# Patient Record
Sex: Female | Born: 1995 | Race: Black or African American | Hispanic: No | Marital: Single | Smoking: Never smoker
Health system: Southern US, Community
[De-identification: ages and names within clinical notes are randomized; demographics above are authoritative.]

## PROBLEM LIST (undated history)

## (undated) ENCOUNTER — Ambulatory Visit (HOSPITAL_COMMUNITY): Disposition: A | Discharge status: Other Acute Inpatient Hospital | Payer: PRIVATE HEALTH INSURANCE

## (undated) DIAGNOSIS — R011 Cardiac murmur, unspecified: Secondary | ICD-10-CM

## (undated) HISTORY — PX: TONSILLECTOMY AND ADENOIDECTOMY: SHX28

## (undated) HISTORY — PX: LARYNGETOMY: SHX5202

---
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
## 2016-07-07 ENCOUNTER — Encounter (HOSPITAL_COMMUNITY): Payer: Self-pay | Admitting: Family Medicine

## 2016-07-07 ENCOUNTER — Ambulatory Visit (HOSPITAL_COMMUNITY)
Admission: EM | Admit: 2016-07-07 | Discharge: 2016-07-07 | Disposition: A | Discharge status: Home / Self Care | Payer: PRIVATE HEALTH INSURANCE | Attending: Family Medicine | Admitting: Family Medicine

## 2016-07-07 DIAGNOSIS — J069 Acute upper respiratory infection, unspecified: Secondary | ICD-10-CM | POA: Diagnosis not present

## 2016-07-07 MED ORDER — IPRATROPIUM BROMIDE 0.06 % NA SOLN: 2.0000 | Freq: Four times a day (QID) | NASAL | 1 refills | Status: DC

## 2016-07-07 MED ORDER — AZITHROMYCIN 250 MG PO TABS: ORAL_TABLET | ORAL | 0 refills | Status: DC

## 2016-07-07 NOTE — Discharge Instructions (Signed)
Drink plenty of fluids as discussed, use medicine as prescribed, and mucinex or delsym for cough. Return or see your doctor if further problems °

## 2016-07-07 NOTE — ED Provider Notes (Signed)
[facility]    CSN: [id number] Arrival date & time: 07/07/16  1057     History   Chief Complaint Chief Complaint  Patient presents with  . Sore Throat  . Otalgia    HPI Mallory Flynn is a 21 y.o. female.   The history is provided by the patient.  Sore Throat  This is a new problem. The current episode started more than 2 days ago. The problem has been gradually worsening. Pertinent negatives include no chest pain, no abdominal pain and no shortness of breath.  Otalgia  Associated symptoms: congestion, cough, hearing loss, rhinorrhea and sore throat   Associated symptoms: no abdominal pain     Past Medical History:  Diagnosis Date  . Heart murmur     There are no active problems to display for this patient.   History reviewed. No pertinent surgical history.  OB History    No data available       Home Medications    Prior to Admission medications   Medication Sig Start Date End Date Taking? Authorizing Provider  naproxen (NAPROSYN) 500 MG tablet Take 1 tablet (500 mg total) by mouth 2 (two) times daily. 02/14/15   Riki SheerMichelle G Young, PA-C    Family History History reviewed. No pertinent family history.  Social History Social History  Substance Use Topics  . Smoking status: Never Smoker  . Smokeless tobacco: Never Used  . Alcohol use No     Allergies   Bactrim [sulfamethoxazole-trimethoprim]   Review of Systems Review of Systems  Constitutional: Negative.   HENT: Positive for congestion, ear pain, hearing loss, postnasal drip, rhinorrhea and sore throat.   Respiratory: Positive for cough. Negative for shortness of breath.   Cardiovascular: Negative for chest pain, palpitations and leg swelling.  Gastrointestinal: Negative for abdominal pain.     Physical Exam Triage Vital Signs ED Triage Vitals [07/07/16 1125]  Enc Vitals Group     BP 118/64     Pulse Rate 67     Resp 18     Temp 98.3 F (36.8 C)     Temp src      SpO2 100 %     Weight      Height      Head Circumference      Peak Flow      Pain Score 8     Pain Loc      Pain Edu?      Excl. in GC?    No data found.   Updated Vital Signs BP 118/64   Pulse 67   Temp 98.3 F (36.8 C)   Resp 18   LMP 07/05/2016   SpO2 100%   Visual Acuity Right Eye Distance:   Left Eye Distance:   Bilateral Distance:    Right Eye Near:   Left Eye Near:    Bilateral Near:     Physical Exam  Constitutional: She is oriented to person, place, and time. She appears well-developed and well-nourished. No distress.  HENT:  Right Ear: External ear and ear canal normal. Tympanic membrane is retracted. A middle ear effusion is present. Decreased hearing is noted.  Left Ear: Hearing, tympanic membrane, external ear and ear canal normal.  Mouth/Throat: Posterior oropharyngeal erythema present. No oropharyngeal exudate.  Cardiovascular: Normal rate, regular rhythm, normal heart sounds and intact distal pulses.   Pulmonary/Chest: Effort normal and breath sounds normal.  Lymphadenopathy:    She has no cervical adenopathy.  Neurological: She is  alert and oriented to person, place, and time.  Skin: Skin is warm and dry.  Nursing note and vitals reviewed.    UC Treatments / Results  Labs (all labs ordered are listed, but only abnormal results are displayed) Labs Reviewed - No data to display  EKG  EKG Interpretation None       Radiology No results found.  Procedures Procedures (including critical care time)  Medications Ordered in UC Medications - No data to display   Initial Impression / Assessment and Plan / UC Course  I have reviewed the triage vital signs and the nursing notes.  Pertinent labs & imaging results that were available during my care of the patient were reviewed by me and considered in my medical decision making (see chart for details).  Clinical Course       Final Clinical Impressions(s) / UC Diagnoses   Final diagnoses:  None     New Prescriptions New Prescriptions   No medications on file     Linna Hoff, MD 07/19/16 1327

## 2016-07-07 NOTE — ED Triage Notes (Signed)
Pt here for right ear pain and right throat pain. sts that she used to have tubes in right ear but fell out back in November.

## 2016-12-27 ENCOUNTER — Ambulatory Visit (HOSPITAL_COMMUNITY)
Admission: EM | Admit: 2016-12-27 | Discharge: 2016-12-27 | Disposition: A | Discharge status: Home / Self Care | Payer: PRIVATE HEALTH INSURANCE | Attending: Family Medicine | Admitting: Family Medicine

## 2016-12-27 ENCOUNTER — Encounter (HOSPITAL_COMMUNITY): Payer: Self-pay | Admitting: *Deleted

## 2016-12-27 DIAGNOSIS — H73011 Bullous myringitis, right ear: Secondary | ICD-10-CM

## 2016-12-27 LAB — POCT PREGNANCY, URINE: PREG TEST UR: NEGATIVE

## 2016-12-27 MED ORDER — FLUCONAZOLE 150 MG PO TABS: 150.0000 mg | ORAL_TABLET | Freq: Once | ORAL | 0 refills | Status: AC

## 2016-12-27 MED ORDER — AMOXICILLIN 500 MG PO CAPS: 500.0000 mg | ORAL_CAPSULE | Freq: Three times a day (TID) | ORAL | 0 refills | Status: DC

## 2016-12-27 NOTE — ED Provider Notes (Signed)
[facility]    CSN: [id number] Arrival date & time: 12/27/16  1726     History   Chief Complaint Chief Complaint  Patient presents with  . Otalgia    HPI Mallory Flynn is a 21 y.o. female.   HPI 21 year old female with history of bilateral myringotomy presents with complaint of right ear pain for 3 days. Pain associated with cough. Pain is worse at night. She denies fever, chills, hearing loss.  She denies left ear pain.    Past Medical History:  Diagnosis Date  . Heart murmur     There are no active problems to display for this patient.   History reviewed. No pertinent surgical history.  OB History    No data available       Home Medications    Prior to Admission medications   Medication Sig Start Date End Date Taking? Authorizing Provider  azithromycin (ZITHROMAX Z-PAK) 250 MG tablet Take as directed on pack 07/07/16   Linna Hoff, MD  ipratropium (ATROVENT) 0.06 % nasal spray Place 2 sprays into both nostrils 4 (four) times daily. 07/07/16   Linna Hoff, MD  naproxen (NAPROSYN) 500 MG tablet Take 1 tablet (500 mg total) by mouth 2 (two) times daily. 02/14/15   Riki Sheer, PA-C    Family History History reviewed. No pertinent family history.  Social History Social History  Substance Use Topics  . Smoking status: Never Smoker  . Smokeless tobacco: Never Used  . Alcohol use No     Allergies   Bactrim [sulfamethoxazole-trimethoprim]   Review of Systems Review of Systems  Constitutional: Negative for chills and fever.  HENT: Positive for ear pain. Negative for ear discharge and hearing loss.   Eyes: Negative for visual disturbance.  Respiratory: Positive for cough. Negative for shortness of breath.   Cardiovascular: Negative for chest pain.  Gastrointestinal: Negative for abdominal pain and blood in stool.  Musculoskeletal: Negative for arthralgias and back pain.  Skin: Negative for rash.  Allergic/Immunologic: Negative for  immunocompromised state.  Hematological: Negative for adenopathy. Does not bruise/bleed easily.  Psychiatric/Behavioral: Negative for dysphoric mood and suicidal ideas.     Physical Exam Triage Vital Signs ED Triage Vitals [12/27/16 1744]  Enc Vitals Group     BP 122/70     Pulse Rate 78     Resp 18     Temp 98.6 F (37 C)     Temp Source Oral     SpO2 100 %     Weight      Height      Head Circumference      Peak Flow      Pain Score      Pain Loc      Pain Edu?      Excl. in GC?    No data found.   Updated Vital Signs BP 122/70 (BP Location: Right Arm)   Pulse 78   Temp 98.6 F (37 C) (Oral)   Resp 18   LMP 11/04/2016 (Exact Date)   SpO2 100%   Visual Acuity Right Eye Distance:   Left Eye Distance:   Bilateral Distance:    Right Eye Near:   Left Eye Near:    Bilateral Near:     Physical Exam  Constitutional: She is oriented to person, place, and time. She appears well-developed and well-nourished. No distress.  HENT:  Head: Normocephalic and atraumatic.  Right Ear: External ear and ear canal normal. Tympanic membrane  is injected, erythematous and bulging.  Left Ear: Tympanic membrane, external ear and ear canal normal.  Ears:  Nose: Mucosal edema present.  Mouth/Throat: No tonsillar abscesses.    Cardiovascular: Normal rate, regular rhythm, normal heart sounds and intact distal pulses.   Pulmonary/Chest: Effort normal and breath sounds normal.  Musculoskeletal: She exhibits no edema.  Neurological: She is alert and oriented to person, place, and time.  Skin: Skin is warm and dry. No rash noted.  Psychiatric: She has a normal mood and affect.     UC Treatments / Results  Labs (all labs ordered are listed, but only abnormal results are displayed) Labs Reviewed - No data to display  EKG  EKG Interpretation None       Radiology No results found.  Procedures Procedures (including critical care time)  Medications Ordered in  UC Medications - No data to display   Initial Impression / Assessment and Plan / UC Course  I have reviewed the triage vital signs and the nursing notes.  Pertinent labs & imaging results that were available during my care of the patient were reviewed by me and considered in my medical decision making (see chart for details).      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 After amoxicillin course, Starting Wed 12/27/2016, Normal         Dessa PhiFunches, Merlyn Conley, MD 12/27/16 2110

## 2016-12-27 NOTE — ED Triage Notes (Signed)
Pt  Has  A  History  Of  Myringotomy     She  Has  A  r  Earache   As   Well  As  A  Cough     She  Repots   The   Pain is  Like   A    Throbbing   She  Is  Also  Late  On  Her  Period

## 2016-12-27 NOTE — Discharge Instructions (Signed)
°  Your urine pregnancy test was negative today.   Please complete course of amoxicillin for middle ear infection. Take the whole 10 days. Take Diflucan to prevent yeast after amoxicillin.  Please establish with a new primary care provider so you may obtain a referral to ENT.  Please return if you develop fever, worsening pain, hearing loss.

## 2017-01-12 ENCOUNTER — Encounter (HOSPITAL_COMMUNITY): Payer: Self-pay | Admitting: Emergency Medicine

## 2017-01-12 ENCOUNTER — Emergency Department (HOSPITAL_COMMUNITY)
Admission: EM | Admit: 2017-01-12 | Discharge: 2017-01-12 | Disposition: A | Discharge status: Home / Self Care | Payer: PRIVATE HEALTH INSURANCE | Attending: Emergency Medicine | Admitting: Emergency Medicine

## 2017-01-12 DIAGNOSIS — Z79899 Other long term (current) drug therapy: Secondary | ICD-10-CM | POA: Insufficient documentation

## 2017-01-12 DIAGNOSIS — N760 Acute vaginitis: Secondary | ICD-10-CM | POA: Insufficient documentation

## 2017-01-12 DIAGNOSIS — N946 Dysmenorrhea, unspecified: Secondary | ICD-10-CM

## 2017-01-12 DIAGNOSIS — R1030 Lower abdominal pain, unspecified: Secondary | ICD-10-CM | POA: Diagnosis present

## 2017-01-12 DIAGNOSIS — B9689 Other specified bacterial agents as the cause of diseases classified elsewhere: Secondary | ICD-10-CM | POA: Insufficient documentation

## 2017-01-12 DIAGNOSIS — R112 Nausea with vomiting, unspecified: Secondary | ICD-10-CM

## 2017-01-12 LAB — WET PREP, GENITAL
SPERM: NONE SEEN
TRICH WET PREP: NONE SEEN
YEAST WET PREP: NONE SEEN

## 2017-01-12 LAB — CBC
HEMATOCRIT: 40.2 % (ref 36.0–46.0)
Hemoglobin: 13.4 g/dL (ref 12.0–15.0)
MCH: 30 pg (ref 26.0–34.0)
MCHC: 33.3 g/dL (ref 30.0–36.0)
MCV: 89.9 fL (ref 78.0–100.0)
PLATELETS: 307 10*3/uL (ref 150–400)
RBC: 4.47 MIL/uL (ref 3.87–5.11)
RDW: 11.9 % (ref 11.5–15.5)
WBC: 7.1 10*3/uL (ref 4.0–10.5)

## 2017-01-12 LAB — COMPREHENSIVE METABOLIC PANEL
ALT: 15 U/L (ref 14–54)
AST: 21 U/L (ref 15–41)
Albumin: 3.9 g/dL (ref 3.5–5.0)
Alkaline Phosphatase: 76 U/L (ref 38–126)
Anion gap: 7 (ref 5–15)
BUN: 5 mg/dL — ABNORMAL LOW (ref 6–20)
CHLORIDE: 109 mmol/L (ref 101–111)
CO2: 22 mmol/L (ref 22–32)
Calcium: 9.1 mg/dL (ref 8.9–10.3)
Creatinine, Ser: 1.08 mg/dL — ABNORMAL HIGH (ref 0.44–1.00)
Glucose, Bld: 108 mg/dL — ABNORMAL HIGH (ref 65–99)
POTASSIUM: 3.6 mmol/L (ref 3.5–5.1)
Sodium: 138 mmol/L (ref 135–145)
Total Bilirubin: 0.3 mg/dL (ref 0.3–1.2)
Total Protein: 7 g/dL (ref 6.5–8.1)

## 2017-01-12 LAB — LIPASE, BLOOD: LIPASE: 26 U/L (ref 11–51)

## 2017-01-12 LAB — I-STAT BETA HCG BLOOD, ED (MC, WL, AP ONLY): I-stat hCG, quantitative: <5 m[IU]/mL (ref <5)

## 2017-01-12 MED ORDER — METRONIDAZOLE 500 MG PO TABS: 500.0000 mg | ORAL_TABLET | Freq: Two times a day (BID) | ORAL | 0 refills | Status: AC

## 2017-01-12 MED ORDER — ONDANSETRON HCL 4 MG/2ML IJ SOLN: 4.0000 mg | Freq: Once | INTRAMUSCULAR | Status: DC | PRN

## 2017-01-12 MED ORDER — SODIUM CHLORIDE 0.9 % IV BOLUS (SEPSIS)
1000.0000 mL | Freq: Once | INTRAVENOUS | Status: AC
  Administered 2017-01-12: 1000 mL via INTRAVENOUS

## 2017-01-12 MED ORDER — KETOROLAC TROMETHAMINE 30 MG/ML IJ SOLN
30.0000 mg | Freq: Once | INTRAMUSCULAR | Status: AC
  Administered 2017-01-12: 30 mg via INTRAVENOUS
  Filled 2017-01-12: qty 1

## 2017-01-12 NOTE — ED Notes (Signed)
Pt given water to drink per Dr. Haviland 

## 2017-01-12 NOTE — ED Provider Notes (Signed)
[facility]   CSN: [id number] Arrival date & time: 01/12/17  4098     History   Chief Complaint Chief Complaint  Patient presents with  . Abdominal Pain  . Vaginal Bleeding    HPI Mallory Flynn is a 21 y.o. female.  Pt presents with midline lower abdominal pain with nausea vomiting (brown, thick, nonbloody)/diarrhea(brown loose, non bloody)  beginning this morning.  She reports before this morning her last bowel movement was 2-3 weeks ago.  Additionally, she reports light vaginal bleeding yesterday and slightly heavier bleeding this morning.  Her LMP was June 16 and she reports it lasting 3 days which is 2 days less than normal.  She denies any dysuria or increased frequency of urination.  She is not on birth control at this time and uses condoms with her boyfriend of 2 years intermittently.  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  She is a never smoker, uses alcohol occasionally 1-2 drinks, and never uses recreational drugs.  She has had no prior abdominal surgeries and is on no medications at this time.  She has no family history of any bleeding disorders or gynecological disorder.        Past Medical History:  Diagnosis Date  . Heart murmur     There are no active problems to display for this patient.   History reviewed. No pertinent surgical history.  OB History    No data available       Home Medications    Prior to Admission medications   Medication Sig Start Date End Date Taking? Authorizing Provider  amoxicillin (AMOXIL) 500 MG capsule Take 1 capsule (500 mg total) by mouth 3 (three) times daily. 12/27/16   Funches, Gerilyn Nestle, MD  ipratropium (ATROVENT) 0.06 % nasal spray Place 2 sprays into both nostrils 4 (four) times daily. 07/07/16   Linna Hoff, MD  naproxen (NAPROSYN) 500 MG tablet Take 1 tablet (500 mg total) by mouth 2 (two) times daily. 02/14/15   Riki Sheer, PA-C    Family History No family  history on file.  Social History Social History  Substance Use Topics  . Smoking status: Never Smoker  . Smokeless tobacco: Never Used  . Alcohol use No     Allergies   Bactrim [sulfamethoxazole-trimethoprim]   Review of Systems Review of Systems  Constitutional: Positive for fatigue. Negative for chills and fever.  HENT: Negative for ear pain and sore throat.   Eyes: Negative for pain and visual disturbance.  Respiratory: Negative for cough and shortness of breath.   Cardiovascular: Negative for chest pain and palpitations.  Gastrointestinal: Positive for abdominal pain, diarrhea, nausea and vomiting.  Genitourinary: Positive for vaginal bleeding. Negative for decreased urine volume, difficulty urinating, dysuria, flank pain, hematuria and urgency.  Musculoskeletal: Negative for arthralgias and back pain.  Skin: Negative for color change and rash.  Neurological: Negative for seizures and syncope.  Hematological: Negative for adenopathy. Does not bruise/bleed easily.  Psychiatric/Behavioral: Negative for agitation, behavioral problems and confusion.  All other systems reviewed and are negative.    Physical Exam Updated Vital Signs BP 126/86 (BP Location: Left Arm)   Pulse 67   Temp 97.9 F (36.6 C) (Oral)   Resp 18   Ht 5\' 4"  (1.626 m)   Wt 74.8 kg (165 lb)   LMP 12/08/2016 (Approximate)   SpO2 100%   BMI 28.32 kg/m   Physical Exam  Constitutional: She  appears well-developed and well-nourished. No distress.  HENT:  Head: Normocephalic and atraumatic.  Eyes: Conjunctivae are normal.  Neck: Neck supple. No JVD present. No thyromegaly present.  Cardiovascular: Normal rate, regular rhythm, normal heart sounds and intact distal pulses.  Exam reveals no friction rub.   No murmur heard. Pulmonary/Chest: Effort normal and breath sounds normal. No stridor. No respiratory distress.  Abdominal: Soft. She exhibits no distension. Bowel sounds are increased. There is no  hepatosplenomegaly. There is no tenderness. There is no tenderness at McBurney's point.    Genitourinary: Vagina normal. Pelvic exam was performed with patient supine. There is no rash, tenderness or lesion on the right labia. There is no rash, tenderness or lesion on the left labia.  Musculoskeletal: She exhibits no edema.  Lymphadenopathy:    She has no cervical adenopathy.  Neurological: She is alert.  Skin: Skin is warm and dry.  Psychiatric: She has a normal mood and affect.  Nursing note and vitals reviewed.    ED Treatments / Results  Labs (all labs ordered are listed, but only abnormal results are displayed) Labs Reviewed  LIPASE, BLOOD  COMPREHENSIVE METABOLIC PANEL  CBC  URINALYSIS, ROUTINE W REFLEX MICROSCOPIC  I-STAT BETA HCG BLOOD, ED ([facility], [facility], [facility] ONLY)    EKG  EKG Interpretation None       Radiology No results found.  Procedures Procedures (including critical care time)  Medications Ordered in ED Medications  ondansetron (ZOFRAN) injection 4 mg (not administered)  sodium chloride 0.9 % bolus 1,000 mL (not administered)     Initial Impression / Assessment and Plan / ED Course  I have reviewed the triage vital signs and the nursing notes.  Pertinent labs & imaging results that were available during my care of the patient were reviewed by me and considered in my medical decision making (see chart for details).   Midline lower abdominal pain: began this morning associated with N/V/Diarrhea.   -Pregnancy Test negative -GC/chlamydia/wet prep ordered -IV fluids -Urinalysis -CBC, CMP normal -wet prep demonstrates clue cells suggestive of bacterial vaginosis discharge with flagyl    Final Clinical Impressions(s) / ED Diagnoses   Final diagnoses:  None    New Prescriptions New Prescriptions   No medications on file     Angelita InglesWinfrey, Daisa Stennis B, MD 01/12/17 1145    Jacalyn LefevreHaviland, Julie, MD 01/12/17 1154

## 2017-01-12 NOTE — Discharge Instructions (Addendum)
Please take antibiotics to completion and avoid alcohol use while on antibiotics

## 2017-01-12 NOTE — ED Triage Notes (Signed)
Pt reports vaginal bleeding since yesterday, denies clots, reports abd pain with n/v/d today.

## 2017-01-12 NOTE — ED Notes (Signed)
Pt reminded that a urine sample is needed; pt verbalized understanding 

## 2017-01-12 NOTE — ED Notes (Signed)
Pt aware that urine sample is needed, but is unable to provide one at this time 

## 2017-01-15 LAB — GC/CHLAMYDIA PROBE AMP (~~LOC~~) NOT AT ARMC
CHLAMYDIA, DNA PROBE: NEGATIVE
NEISSERIA GONORRHEA: NEGATIVE

## 2017-11-17 ENCOUNTER — Inpatient Hospital Stay (HOSPITAL_COMMUNITY)
Admission: AD | Admit: 2017-11-17 | Discharge: 2017-11-17 | Disposition: A | Discharge status: Home / Self Care | Payer: Managed Care, Other (non HMO) | Source: Ambulatory Visit | Attending: Obstetrics and Gynecology | Admitting: Obstetrics and Gynecology

## 2017-11-17 ENCOUNTER — Encounter (HOSPITAL_COMMUNITY): Payer: Self-pay | Admitting: *Deleted

## 2017-11-17 DIAGNOSIS — N926 Irregular menstruation, unspecified: Secondary | ICD-10-CM | POA: Insufficient documentation

## 2017-11-17 DIAGNOSIS — Z8249 Family history of ischemic heart disease and other diseases of the circulatory system: Secondary | ICD-10-CM | POA: Diagnosis not present

## 2017-11-17 DIAGNOSIS — Z3202 Encounter for pregnancy test, result negative: Secondary | ICD-10-CM | POA: Insufficient documentation

## 2017-11-17 DIAGNOSIS — Z882 Allergy status to sulfonamides status: Secondary | ICD-10-CM | POA: Insufficient documentation

## 2017-11-17 DIAGNOSIS — N939 Abnormal uterine and vaginal bleeding, unspecified: Secondary | ICD-10-CM | POA: Diagnosis present

## 2017-11-17 LAB — CBC
HCT: 38.5 % (ref 36.0–46.0)
HEMOGLOBIN: 12.8 g/dL (ref 12.0–15.0)
MCH: 30.7 pg (ref 26.0–34.0)
MCHC: 33.2 g/dL (ref 30.0–36.0)
MCV: 92.3 fL (ref 78.0–100.0)
Platelets: 294 10*3/uL (ref 150–400)
RBC: 4.17 MIL/uL (ref 3.87–5.11)
RDW: 12.2 % (ref 11.5–15.5)
WBC: 6.5 10*3/uL (ref 4.0–10.5)

## 2017-11-17 LAB — POCT PREGNANCY, URINE: Preg Test, Ur: NEGATIVE

## 2017-11-17 LAB — URINALYSIS, ROUTINE W REFLEX MICROSCOPIC
Bilirubin Urine: NEGATIVE
GLUCOSE, UA: NEGATIVE mg/dL
HGB URINE DIPSTICK: NEGATIVE
Ketones, ur: NEGATIVE mg/dL
LEUKOCYTES UA: NEGATIVE
Nitrite: NEGATIVE
PH: 7 (ref 5.0–8.0)
Protein, ur: NEGATIVE mg/dL
Specific Gravity, Urine: 1.015 (ref 1.005–1.030)

## 2017-11-17 LAB — HCG, QUANTITATIVE, PREGNANCY: hCG, Beta Chain, Quant, S: <1 m[IU]/mL (ref <5)

## 2017-11-17 NOTE — Discharge Instructions (Signed)
Abnormal Uterine Bleeding Abnormal uterine bleeding can affect women at various stages in life, including teenagers, women in their reproductive years, pregnant women, and women who have reached menopause. Several kinds of uterine bleeding are considered abnormal, including:  Bleeding or spotting between periods.  Bleeding after sexual intercourse.  Bleeding that is heavier or more than normal.  Periods that last longer than usual.  Bleeding after menopause. Many cases of abnormal uterine bleeding are minor and simple to treat, while others are more serious. Any type of abnormal bleeding should be evaluated by your health care provider. Treatment will depend on the cause of the bleeding. Follow these instructions at home: Monitor your condition for any changes. The following actions may help to alleviate any discomfort you are experiencing:  Avoid the use of tampons and douches as directed by your health care provider.  Change your pads frequently. You should get regular pelvic exams and Pap tests. Keep all follow-up appointments for diagnostic tests as directed by your health care provider. Contact a health care provider if:  Your bleeding lasts more than 1 week.  You feel dizzy at times. Get help right away if:  You pass out.  You are changing pads every 15 to 30 minutes.  You have abdominal pain.  You have a fever.  You become sweaty or weak.  You are passing large blood clots from the vagina.  You start to feel nauseous and vomit. This information is not intended to replace advice given to you by your health care provider. Make sure you discuss any questions you have with your health care provider. Document Released: 06/19/2005 Document Revised: 12/01/2015 Document Reviewed: 01/16/2013 Elsevier Interactive Patient Education  2017 Elsevier Inc.  

## 2017-11-17 NOTE — MAU Provider Note (Signed)
History     CSN: [id number]  Arrival date and time: 11/17/17 1239   First Provider Initiated Contact with Patient 11/17/17 1326     Chief Complaint  Patient presents with  . Vaginal Bleeding  . Possible Pregnancy   HPI Mallory Flynn is a 22 y.o. G0P0 at Unknown who presents requesting a D&C. She states she had a miscarriage last night and her husband suggested she come in for surgery. She states her LMP was 2/26 and she had a positive pregnancy test on 3/5. She states last night she had some bleeding and passed "a baby." She states it was the size of a quarter. She is having a small amount of dark red bleeding now. Denies any pain.  OB History   None     Past Medical History:  Diagnosis Date  . Heart murmur     Past Surgical History:  Procedure Laterality Date  . LARYNGETOMY    . TONSILLECTOMY AND ADENOIDECTOMY     2006    Family History  Problem Relation Age of Onset  . Hypertension Father     Social History   Tobacco Use  . Smoking status: Never Smoker  . Smokeless tobacco: Never Used  Substance Use Topics  . Alcohol use: No  . Drug use: No    Allergies:  Allergies  Allergen Reactions  . Bactrim [Sulfamethoxazole-Trimethoprim] Hives    Medications Prior to Admission  Medication Sig Dispense Refill Last Dose  . amoxicillin (AMOXIL) 500 MG capsule Take 1 capsule (500 mg total) by mouth 3 (three) times daily. (Patient not taking: Reported on 01/12/2017) 30 capsule 0 Not Taking at Unknown time  . ipratropium (ATROVENT) 0.06 % nasal spray Place 2 sprays into both nostrils 4 (four) times daily. (Patient not taking: Reported on 01/12/2017) 15 mL 1 Not Taking at Unknown time  . naproxen (NAPROSYN) 500 MG tablet Take 1 tablet (500 mg total) by mouth 2 (two) times daily. 30 tablet 0 Past Week at Unknown time    Review of Systems  Constitutional: Negative.  Negative for fatigue and fever.  HENT: Negative.   Respiratory: Negative.  Negative for shortness of breath.    Cardiovascular: Negative.  Negative for chest pain.  Gastrointestinal: Negative.  Negative for abdominal pain, constipation, diarrhea, nausea and vomiting.  Genitourinary: Positive for vaginal bleeding. Negative for dysuria and vaginal discharge.  Neurological: Negative.  Negative for dizziness and headaches.   Physical Exam   Blood pressure 123/74, pulse 72, temperature 98.1 F (36.7 C), temperature source Oral, resp. rate 16, weight 143 lb 0.6 oz (64.9 kg), last menstrual period 08/26/2017, SpO2 100 %.  Physical Exam  Nursing note and vitals reviewed. Constitutional: She is oriented to person, place, and time. She appears well-developed and well-nourished. No distress.  HENT:  Head: Normocephalic.  Eyes: Pupils are equal, round, and reactive to light.  Cardiovascular: Normal rate, regular rhythm and normal heart sounds.  Respiratory: Effort normal and breath sounds normal. No respiratory distress.  GI: Soft. Bowel sounds are normal. She exhibits no distension. There is no tenderness.  Neurological: She is alert and oriented to person, place, and time.  Skin: Skin is warm and dry.  Psychiatric: She has a normal mood and affect. Her behavior is normal. Judgment and thought content normal.    MAU Course  Procedures Results for orders placed or performed during the hospital encounter of 11/17/17 (from the past 24 hour(s))  Urinalysis, Routine w reflex microscopic     Status:  None   Collection Time: 11/17/17  1:00 PM  Result Value Ref Range   Color, Urine YELLOW YELLOW   APPearance CLEAR CLEAR   Specific Gravity, Urine 1.015 1.005 - 1.030   pH 7.0 5.0 - 8.0   Glucose, UA NEGATIVE NEGATIVE mg/dL   Hgb urine dipstick NEGATIVE NEGATIVE   Bilirubin Urine NEGATIVE NEGATIVE   Ketones, ur NEGATIVE NEGATIVE mg/dL   Protein, ur NEGATIVE NEGATIVE mg/dL   Nitrite NEGATIVE NEGATIVE   Leukocytes, UA NEGATIVE NEGATIVE  Pregnancy, urine POC     Status: None   Collection Time: 11/17/17   1:22 PM  Result Value Ref Range   Preg Test, Ur NEGATIVE NEGATIVE  CBC     Status: None   Collection Time: 11/17/17  1:33 PM  Result Value Ref Range   WBC 6.5 4.0 - 10.5 K/uL   RBC 4.17 3.87 - 5.11 MIL/uL   Hemoglobin 12.8 12.0 - 15.0 g/dL   HCT 16.1 09.6 - 04.5 %   MCV 92.3 78.0 - 100.0 fL   MCH 30.7 26.0 - 34.0 pg   MCHC 33.2 30.0 - 36.0 g/dL   RDW 40.9 81.1 - 91.4 %   Platelets 294 150 - 400 K/uL  hCG, quantitative, pregnancy     Status: None   Collection Time: 11/17/17  1:33 PM  Result Value Ref Range   hCG, Beta Chain, Quant, S <1 <5 mIU/mL   MDM UA, UPT CBC, HCG Patient declined pelvic exam. States she is no longer having bleeding or pain.  Reviewed negative HCG results with patient. Explained that the bleeding last night was not from a miscarriage and that she has not been pregnant recently. Patient verbalized understanding.   Assessment and Plan   1. Irregular menses   2. Pregnancy examination or test, negative result    -Discharge home in stable condition -Vaginal beeding precautions discussed -Patient advised to follow-up with gyn of choice for routine needs -Patient may return to MAU as needed or if her condition were to change or worsen  Rolm Bookbinder CNM 11/17/2017, 1:26 PM

## 2017-11-17 NOTE — MAU Note (Signed)
Patient reports that she had a miscarriage at home yesterday. Endorses having passed an "embryo".  +dark red vaginal bleeding LMP 08/26/17  Denies any pain at this time. States did have lower back pain that she rated a 6/10 yesterday.  Came in because she was told by her husband that she needs a DNC.

## 2018-04-27 ENCOUNTER — Encounter (HOSPITAL_COMMUNITY): Payer: Self-pay | Admitting: Emergency Medicine

## 2018-04-27 ENCOUNTER — Ambulatory Visit (HOSPITAL_COMMUNITY)
Admission: EM | Admit: 2018-04-27 | Discharge: 2018-04-27 | Disposition: A | Discharge status: Home / Self Care | Payer: Managed Care, Other (non HMO) | Attending: Family Medicine | Admitting: Family Medicine

## 2018-04-27 ENCOUNTER — Other Ambulatory Visit: Payer: Self-pay

## 2018-04-27 DIAGNOSIS — J069 Acute upper respiratory infection, unspecified: Secondary | ICD-10-CM

## 2018-04-27 NOTE — ED Provider Notes (Signed)
[facility]   [id number] 04/27/18 Arrival Time: 1548  ASSESSMENT & PLAN:  1. Viral upper respiratory tract infection    Discussed typical duration of symptoms. OTC symptom care as needed. Ensure adequate fluid intake and rest. May f/u with PCP or here as needed.  Reviewed expectations re: course of current medical issues. Questions answered. Outlined signs and symptoms indicating need for more acute intervention. Patient verbalized understanding. After Visit Summary given.   SUBJECTIVE: History from: patient.  Mallory Flynn is a 22 y.o. female who presents with complaint of nasal congestion, post-nasal drainage, and a mild dry cough. Onset abrupt, yesterday. Overall without fatigue and without body aches. SOB: none. Wheezing: none. Fever: no. Overall normal PO intake without n/v. Sick contacts: no. No specific or significant aggravating or alleviating factors reported. OTC treatment: none reported. Nasal congestion bothering her the most.  Received flu shot this year: no.  Social History   Tobacco Use  Smoking Status Never Smoker  Smokeless Tobacco Never Used    ROS: As per HPI.   OBJECTIVE:  Vitals:   04/27/18 1604  BP: 101/61  Pulse: 89  Resp: 16  Temp: 98.4 F (36.9 C)  TempSrc: Oral  SpO2: 100%     General appearance: alert; appears fatigued HEENT: nasal congestion; clear runny nose; throat irritation secondary to post-nasal drainage Neck: supple without LAD Lungs: unlabored respirations, symmetrical air entry without wheezing; cough: absent Psychological: alert and cooperative; normal mood and affect   Allergies  Allergen Reactions  . Bactrim [Sulfamethoxazole-Trimethoprim] Hives    Past Medical History:  Diagnosis Date  . Heart murmur    Family History  Problem Relation Age of Onset  . Hypertension Father    Social History   Socioeconomic History  . Marital status: Single    Spouse name: Not on file  . Number of children:  Not on file  . Years of education: Not on file  . Highest education level: Not on file  Occupational History  . Not on file  Social Needs  . Financial resource strain: Not on file  . Food insecurity:    Worry: Not on file    Inability: Not on file  . Transportation needs:    Medical: Not on file    Non-medical: Not on file  Tobacco Use  . Smoking status: Never Smoker  . Smokeless tobacco: Never Used  Substance and Sexual Activity  . Alcohol use: No  . Drug use: No  . Sexual activity: Yes    Birth control/protection: None  Lifestyle  . Physical activity:    Days per week: Not on file    Minutes per session: Not on file  . Stress: Not on file  Relationships  . Social connections:    Talks on phone: Not on file    Gets together: Not on file    Attends religious service: Not on file    Active member of club or organization: Not on file    Attends meetings of clubs or organizations: Not on file    Relationship status: Not on file  . Intimate partner violence:    Fear of current or ex partner: Not on file    Emotionally abused: Not on file    Physically abused: Not on file    Forced sexual activity: Not on file  Other Topics Concern  . Not on file  Social History Narrative  . Not on file           Brook,  Arlys John, MD 04/27/18 [phone]

## 2018-04-27 NOTE — ED Triage Notes (Signed)
The patient presented to the [facility] with a complaint of sinus pain and pressure x 2 days.  

## 2018-04-27 NOTE — Discharge Instructions (Signed)
You may try any over the counter cold medicines. Do your best to ensure adequate fluid intake and rest.

## 2019-12-08 ENCOUNTER — Encounter (HOSPITAL_COMMUNITY): Payer: Self-pay

## 2019-12-08 ENCOUNTER — Emergency Department (HOSPITAL_COMMUNITY)
Admission: EM | Admit: 2019-12-08 | Discharge: 2019-12-09 | Disposition: A | Discharge status: Home / Self Care | Payer: Self-pay | Attending: Emergency Medicine | Admitting: Emergency Medicine

## 2019-12-08 ENCOUNTER — Other Ambulatory Visit: Payer: Self-pay

## 2019-12-08 DIAGNOSIS — Z5321 Procedure and treatment not carried out due to patient leaving prior to being seen by health care provider: Secondary | ICD-10-CM | POA: Insufficient documentation

## 2019-12-08 DIAGNOSIS — N939 Abnormal uterine and vaginal bleeding, unspecified: Secondary | ICD-10-CM | POA: Insufficient documentation

## 2019-12-08 LAB — I-STAT BETA HCG BLOOD, ED (MC, WL, AP ONLY): I-stat hCG, quantitative: <5 m[IU]/mL (ref <5)

## 2019-12-08 NOTE — ED Triage Notes (Signed)
Pt arrives POV for eval of vag bleeding x 1.5 weeks. Pt reports unknown preg status. States she finished her period, stopped bleeding and then started shortly thereafter with this 1.5 weeks worth of bleeding. Pt reports LMP 5/15-5/19. Denies abd pain, back pain, cramping\

## 2019-12-09 NOTE — ED Notes (Signed)
Pt called for vital x3. No answer.

## 2020-01-13 ENCOUNTER — Emergency Department (HOSPITAL_COMMUNITY): Payer: Self-pay

## 2020-01-13 ENCOUNTER — Emergency Department (HOSPITAL_COMMUNITY)
Admission: EM | Admit: 2020-01-13 | Discharge: 2020-01-13 | Disposition: A | Discharge status: Home / Self Care | Payer: Self-pay | Attending: Emergency Medicine | Admitting: Emergency Medicine

## 2020-01-13 ENCOUNTER — Encounter (HOSPITAL_COMMUNITY): Payer: Self-pay | Admitting: Emergency Medicine

## 2020-01-13 DIAGNOSIS — Y939 Activity, unspecified: Secondary | ICD-10-CM | POA: Insufficient documentation

## 2020-01-13 DIAGNOSIS — Y999 Unspecified external cause status: Secondary | ICD-10-CM | POA: Insufficient documentation

## 2020-01-13 DIAGNOSIS — S92354A Nondisplaced fracture of fifth metatarsal bone, right foot, initial encounter for closed fracture: Secondary | ICD-10-CM | POA: Insufficient documentation

## 2020-01-13 DIAGNOSIS — Y9259 Other trade areas as the place of occurrence of the external cause: Secondary | ICD-10-CM | POA: Insufficient documentation

## 2020-01-13 DIAGNOSIS — W51XXXA Accidental striking against or bumped into by another person, initial encounter: Secondary | ICD-10-CM | POA: Insufficient documentation

## 2020-01-13 DIAGNOSIS — R52 Pain, unspecified: Secondary | ICD-10-CM

## 2020-01-13 MED ORDER — NAPROXEN 500 MG PO TABS: 500.0000 mg | ORAL_TABLET | Freq: Two times a day (BID) | ORAL | 0 refills | Status: AC

## 2020-01-13 MED ORDER — NAPROXEN 250 MG PO TABS
500.0000 mg | ORAL_TABLET | Freq: Once | ORAL | Status: AC
  Administered 2020-01-13: 500 mg via ORAL
  Filled 2020-01-13: qty 2
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
## 2020-01-13 NOTE — Discharge Instructions (Signed)
We have placed your foot on a postop shoe.  You will need to keep this in place for the next few weeks.  The number to the orthopedist is attached to your chart, please schedule an appointment for further management of your broken right pinky toe.  I have prescribed a short course of anti-inflammatories for you to take, take 1 tablet twice a day for the next 7 days.

## 2021-09-08 ENCOUNTER — Encounter (HOSPITAL_COMMUNITY): Payer: Self-pay | Admitting: Emergency Medicine

## 2021-09-08 ENCOUNTER — Emergency Department (HOSPITAL_COMMUNITY)
Admission: EM | Admit: 2021-09-08 | Discharge: 2021-09-08 | Disposition: A | Discharge status: Home / Self Care | Payer: PRIVATE HEALTH INSURANCE | Attending: Emergency Medicine | Admitting: Emergency Medicine

## 2021-09-08 DIAGNOSIS — U071 COVID-19: Secondary | ICD-10-CM | POA: Insufficient documentation

## 2021-09-08 MED ORDER — FLUTICASONE PROPIONATE 50 MCG/ACT NA SUSP: 2.0000 | Freq: Every day | NASAL | 0 refills | Status: AC

## 2021-09-08 MED ORDER — BENZONATATE 100 MG PO CAPS: 100.0000 mg | ORAL_CAPSULE | Freq: Three times a day (TID) | ORAL | 0 refills | Status: AC

## 2021-09-08 NOTE — ED Provider Notes (Signed)
?[facility]-EMERGENCY DEPT ?Provider Note ? ? ?CSN: [id number] ?Arrival date & time: 09/08/21  2102 ? ?  ? ?History ? ?Chief Complaint  ?Patient presents with  ? Covid Positive  ? ? ?LEONARD HENDLER is a 26 y.o. female here for positive COVID test at home.  She denies any recent exposures.  She denies any current symptoms.  No headache, congestion, rhinorrhea, fever, emesis, cough, abdominal pain, diarrhea or dysuria.  States "something inside me tell me to take a test."  And it was positive.  She has normal appetite, tolerating p.o. intake at home without difficulty ? ?HPI ? ?  ? ?Home Medications ?Prior to Admission medications   ?Medication Sig Start Date End Date Taking? Authorizing Provider  ?benzonatate (TESSALON) 100 MG capsule Take 1 capsule (100 mg total) by mouth every 8 (eight) hours. 09/08/21  Yes Jerolene Kupfer A, PA-C  ?fluticasone (FLONASE) 50 MCG/ACT nasal spray Place 2 sprays into both nostrils daily. 09/08/21  Yes Alinah Sheard A, PA-C  ?   ? ?Allergies    ?Sulfamethoxazole-trimethoprim and Bactrim [sulfamethoxazole-trimethoprim]   ? ?Review of Systems   ?Review of Systems  ?Constitutional: Negative.   ?HENT: Negative.    ?Respiratory: Negative.    ?Cardiovascular: Negative.   ?Gastrointestinal: Negative.   ?Genitourinary: Negative.   ?Musculoskeletal: Negative.   ?Skin: Negative.   ?Neurological: Negative.   ?All other systems reviewed and are negative. ? ?Physical Exam ?Updated Vital Signs ?BP 120/78   Pulse 91   Temp 98.7 ?F (37.1 ?C) (Oral)   Resp 16   SpO2 100%  ?Physical Exam ?Vitals and nursing note reviewed.  ?Constitutional:   ?   General: She is not in acute distress. ?   Appearance: She is well-developed. She is not ill-appearing.  ?HENT:  ?   Head: Normocephalic and atraumatic.  ?   Nose: Nose normal.  ?   Mouth/Throat:  ?   Mouth: Mucous membranes are moist.  ?Eyes:  ?   Pupils: Pupils are equal, round, and reactive to light.  ?Cardiovascular:  ?   Rate and Rhythm:  Normal rate.  ?   Pulses: Normal pulses.  ?   Heart sounds: Normal heart sounds.  ?Pulmonary:  ?   Effort: Pulmonary effort is normal. No respiratory distress.  ?   Breath sounds: Normal breath sounds.  ?Abdominal:  ?   General: Bowel sounds are normal. There is no distension.  ?   Palpations: Abdomen is soft.  ?   Tenderness: There is no abdominal tenderness. There is no guarding or rebound.  ?Musculoskeletal:     ?   General: No swelling or tenderness. Normal range of motion.  ?   Cervical back: Normal range of motion.  ?Skin: ?   General: Skin is warm and dry.  ?   Capillary Refill: Capillary refill takes less than 2 seconds.  ?Neurological:  ?   General: No focal deficit present.  ?   Mental Status: She is alert and oriented to person, place, and time.  ?Psychiatric:     ?   Mood and Affect: Mood normal.  ? ? ?ED Results / Procedures / Treatments   ?Labs ?(all labs ordered are listed, but only abnormal results are displayed) ?Labs Reviewed - No data to display ? ?EKG ?None ? ?Radiology ?No results found. ? ?Procedures ?Procedures  ? ? ?Medications Ordered in ED ?Medications - No data to display ? ?ED Course/ Medical Decision Making/ A&P ?  ? ?26 year old appears  otherwise well here for evaluation of positive COVID test.  She denies any current symptoms or exposures.  She appears otherwise well, heart and lungs clear.  Abdomen soft, nontender, tolerating p.o. intake.  She is ambulatory here.  Do not feel we need to repeat her COVID test.  We will write her for a work note as well as a few medications in the meantime in case she develops cough or runny nose.  We will have her follow-up outpatient. ? ?The patient has been appropriately medically screened and/or stabilized in the ED. I have low suspicion for any other emergent medical condition which would require further screening, evaluation or treatment in the ED or require inpatient management. ? ?Patient is hemodynamically stable and in no acute distress.   Patient able to ambulate in department prior to ED.  Evaluation does not show acute pathology that would require ongoing or additional emergent interventions while in the emergency department or further inpatient treatment.  I have discussed the diagnosis with the patient and answered all questions.  Pain is been managed while in the emergency department and patient has no further complaints prior to discharge.  Patient is comfortable with plan discussed in room and is stable for discharge at this time.  I have discussed strict return precautions for returning to the emergency department.  Patient was encouraged to follow-up with PCP/specialist refer to at discharge.  ? ?                        ?Medical Decision Making ?Risk ?OTC drugs. ?Prescription drug management. ? ? ? ? ? ? ? ? ? ?Final Clinical Impression(s) / ED Diagnoses ?Final diagnoses:  ?COVID  ? ? ?Rx / DC Orders ?ED Discharge Orders   ? ?      Ordered  ?  benzonatate (TESSALON) 100 MG capsule  Every 8 hours       ? 09/08/21 2145  ?  fluticasone (FLONASE) 50 MCG/ACT nasal spray  Daily       ? 09/08/21 2145  ? ?  ?  ? ?  ? ? ?  ?Indea Dearman A, PA-C ?09/08/21 2147 ? ?  ?Mancel Bale, MD ?09/08/21 2313 ? ?

## 2021-09-08 NOTE — ED Triage Notes (Signed)
Pt reports that she tested positive for COVID yesterday. Denies symptoms, just thought she should get "checked" ?

## 2021-09-08 NOTE — Discharge Instructions (Addendum)
I have written you for a few medications if you develop any symptoms, I have written you for a work note as well.  Return for new or worsening symptom ?

## 2021-12-28 IMAGING — CR DG FOOT COMPLETE 3+V*R*
4 series · 4 of 4 positions shown · non-contrast
Comparison: None.

CLINICAL DATA: Right foot pain at the fifth metatarsal and fifth
toe for 2 days. Bruising and swelling.

EXAM:
RIGHT FOOT COMPLETE - 3+ VIEW

[foot ap]
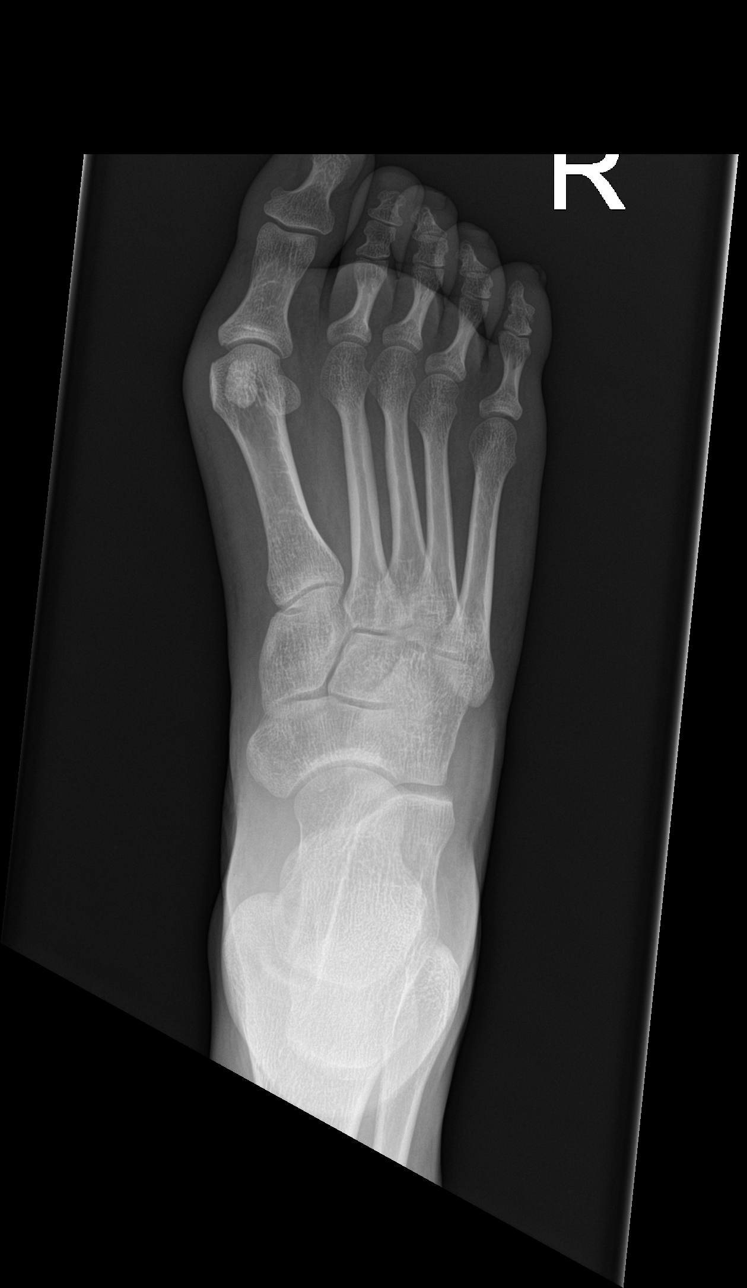

[foot obl]
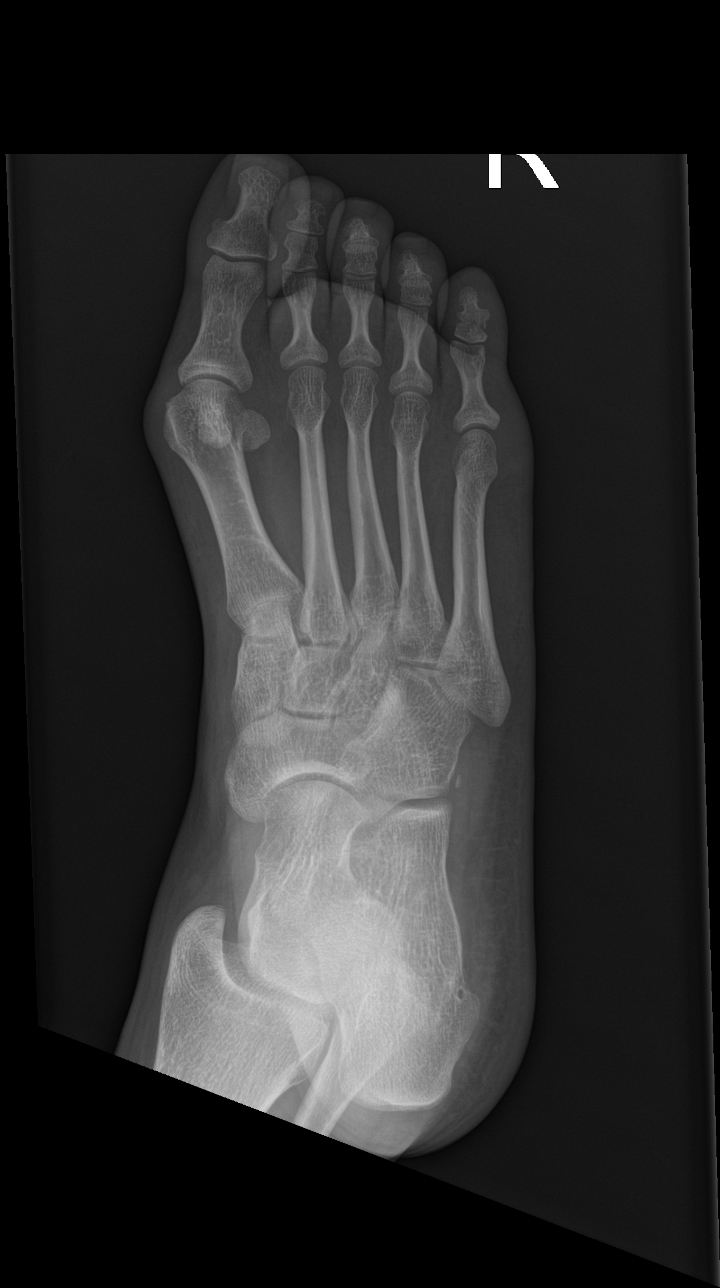

[foot lat (1 of 2)]
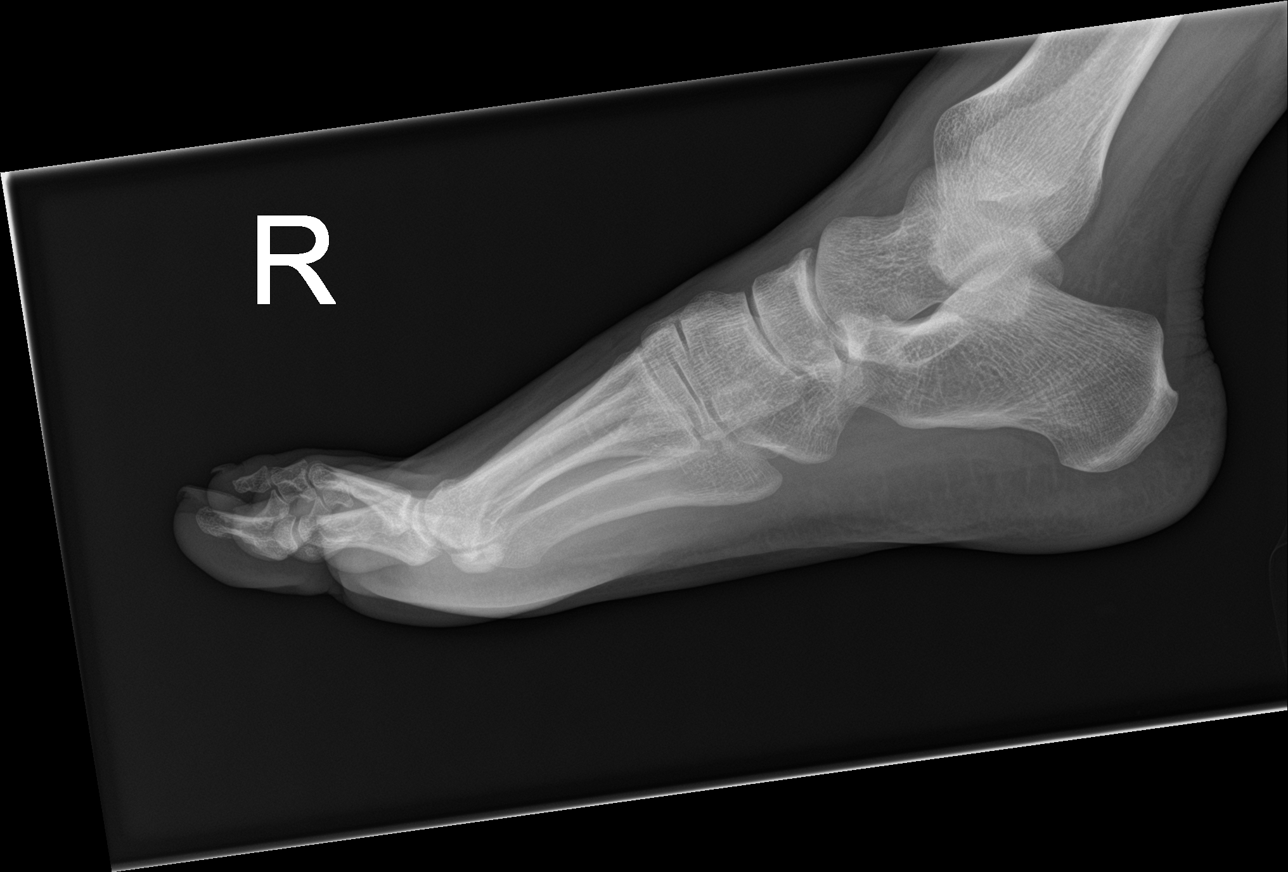

[foot lat (2 of 2)]
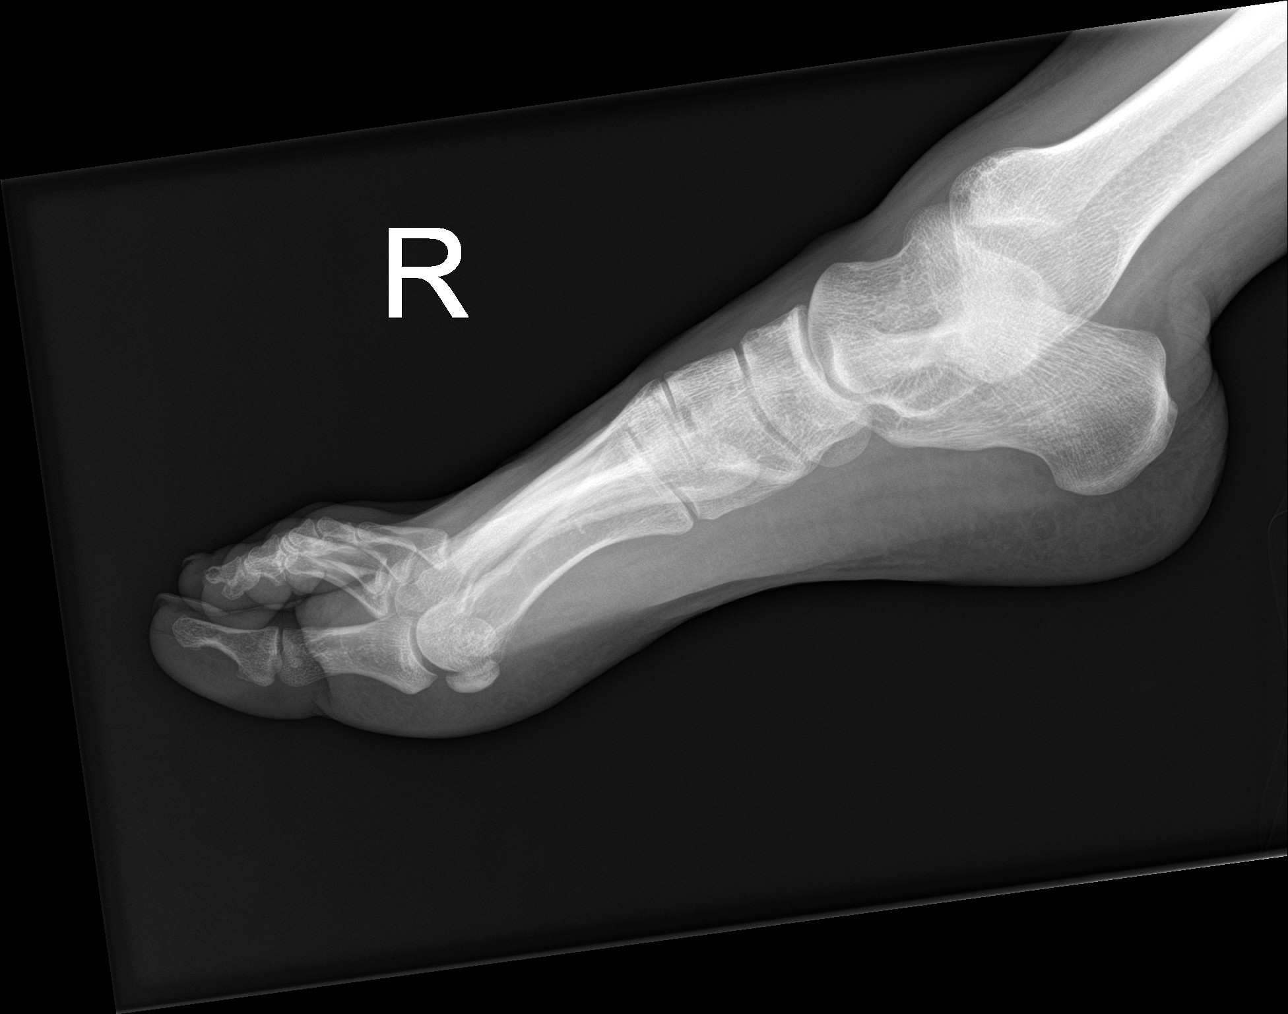

[4 of 4 positions shown; findings below may reference images not displayed]

FINDINGS: There is a nondisplaced fracture of the distal phalanx of the fifth
toe with mild regional soft tissue swelling. There is no
dislocation. No suspicious osseous lesion is identified.
IMPRESSION: Nondisplaced fracture of the fifth toe distal phalanx.

## 2022-08-14 ENCOUNTER — Emergency Department (HOSPITAL_COMMUNITY)
Admission: EM | Admit: 2022-08-14 | Discharge: 2022-08-14 | Disposition: A | Discharge status: Home / Self Care | Payer: PRIVATE HEALTH INSURANCE | Attending: Emergency Medicine | Admitting: Emergency Medicine

## 2022-08-14 ENCOUNTER — Other Ambulatory Visit: Payer: Self-pay

## 2022-08-14 DIAGNOSIS — R0981 Nasal congestion: Secondary | ICD-10-CM | POA: Diagnosis present

## 2022-08-14 DIAGNOSIS — J069 Acute upper respiratory infection, unspecified: Secondary | ICD-10-CM | POA: Diagnosis not present

## 2022-08-14 MED ORDER — BENZONATATE 100 MG PO CAPS: 100.0000 mg | ORAL_CAPSULE | Freq: Three times a day (TID) | ORAL | 0 refills | Status: AC

## 2022-08-14 NOTE — ED Triage Notes (Signed)
C/o fatigue, headache, runny nose, congestion, and cough since Wednesday  2 at home covid test positive on Friday.

## 2022-08-14 NOTE — Discharge Instructions (Signed)
It was a pleasure taking care of you today.  As discussed, I am sending you home with cough medication.  Take as needed for cough.  Your COVID and flu test are pending.  Results should be available within the next few hours.  Return to the ER for new or worsening symptoms.

## 2022-08-14 NOTE — ED Provider Notes (Signed)
[place] AT [facility] Provider Note   CSN: [id number] Arrival date & time: 08/14/22  1747     History  Chief Complaint  Patient presents with   Nasal Congestion    Mallory Flynn is a 27 y.o. female with a past medical history significant for heart murmur who presents to the ED due to fatigue, headache, rhinorrhea, nasal congestion, and cough that started a few days ago.  Patient has had 2 positive at-home COVID test. 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 No chest pain or shortness of breath. Denies abdominal pain. No SOB.   History obtained from patient and past medical records. No interpreter used during encounter.       Home Medications Prior to Admission medications   Medication Sig Start Date End Date Taking? Authorizing Provider  benzonatate (TESSALON) 100 MG capsule Take 1 capsule (100 mg total) by mouth every 8 (eight) hours. 09/08/21   Henderly, Britni A, PA-C  fluticasone (FLONASE) 50 MCG/ACT nasal spray Place 2 sprays into both nostrils daily. 09/08/21   Henderly, Britni A, PA-C      Allergies    Sulfamethoxazole-trimethoprim and Bactrim [sulfamethoxazole-trimethoprim]    Review of Systems   Review of Systems  Constitutional:  Positive for fatigue. Negative for chills and fever.  Respiratory:  Positive for cough. Negative for shortness of breath.   Cardiovascular:  Negative for chest pain.  Gastrointestinal:  Negative for abdominal pain.  All other systems reviewed and are negative.   Physical Exam Updated Vital Signs BP 119/87 (BP Location: Left Arm)   Pulse 70   Temp 98.9 F (37.2 C) (Oral)   Resp 14   Ht 5' 4"$  (1.626 m)   Wt 60.3 kg   SpO2 100%   BMI 22.83 kg/m  Physical Exam Vitals and nursing note reviewed.  Constitutional:      General: She is not in acute distress.    Appearance: She is not ill-appearing.  HENT:     Head: Normocephalic.  Eyes:     Pupils: Pupils are equal,  round, and reactive to light.  Cardiovascular:     Rate and Rhythm: Normal rate and regular rhythm.     Pulses: Normal pulses.     Heart sounds: Normal heart sounds. No murmur heard.    No friction rub. No gallop.  Pulmonary:     Effort: Pulmonary effort is normal.     Breath sounds: Normal breath sounds.  Abdominal:     General: Abdomen is flat. There is no distension.     Palpations: Abdomen is soft.     Tenderness: There is no abdominal tenderness. There is no guarding or rebound.  Musculoskeletal:        General: Normal range of motion.     Cervical back: Neck supple.  Skin:    General: Skin is warm and dry.  Neurological:     General: No focal deficit present.     Mental Status: She is alert.  Psychiatric:        Mood and Affect: Mood normal.        Behavior: Behavior normal.     ED Results / Procedures / Treatments   Labs (all labs ordered are listed, but only abnormal results are displayed) Labs Reviewed - No data to display  EKG None  Radiology No results found.  Procedures Procedures    Medications Ordered in ED Medications - No data to display  ED Course/ Medical Decision Making/ A&P                             Medical Decision Making  27 year old female presents to the ED due to fatigue, headache, rhinorrhea, and cough for the past few days.  Had 2 at home positive COVID test.  No chest pain or shortness of breath.  No medical conditions.  Upon arrival, patient is afebrile, not tachycardic or hypoxic.  Patient in no acute distress.  Benign physical exam.  Lungs clear to auscultation bilaterally.  Low suspicion for pneumonia.  No meningismus to suggest meningitis.  Abdomen soft, nondistended, nontender.  Respiratory panel ordered in triage.  Patient prefers to be discharged and check results on MyChart.  Patient discharged with symptomatic treatment.  Patient stable for discharge. Strict ED precautions discussed with patient. Patient states understanding  and agrees to plan. Patient discharged home in no acute distress and stable vitals  No PCP Hx. Heart murmur        Final Clinical Impression(s) / ED Diagnoses Final diagnoses:  None    Rx / DC Orders ED Discharge Orders     None         Karie Kirks 08/14/22 Hortencia Pilar, MD 08/18/22 1056

## 2024-02-24 ENCOUNTER — Emergency Department (HOSPITAL_COMMUNITY): Payer: Self-pay

## 2024-02-24 ENCOUNTER — Encounter (HOSPITAL_COMMUNITY): Payer: Self-pay

## 2024-02-24 ENCOUNTER — Emergency Department (HOSPITAL_COMMUNITY)
Admission: EM | Admit: 2024-02-24 | Discharge: 2024-02-24 | Disposition: A | Discharge status: Home / Self Care | Payer: Self-pay | Attending: Emergency Medicine | Admitting: Emergency Medicine

## 2024-02-24 ENCOUNTER — Other Ambulatory Visit: Payer: Self-pay

## 2024-02-24 DIAGNOSIS — X58XXXA Exposure to other specified factors, initial encounter: Secondary | ICD-10-CM | POA: Diagnosis not present

## 2024-02-24 DIAGNOSIS — S93402A Sprain of unspecified ligament of left ankle, initial encounter: Secondary | ICD-10-CM | POA: Insufficient documentation

## 2024-02-24 DIAGNOSIS — M79672 Pain in left foot: Secondary | ICD-10-CM | POA: Diagnosis present

## 2024-02-24 MED ORDER — IBUPROFEN 600 MG PO TABS: 600.0000 mg | ORAL_TABLET | Freq: Four times a day (QID) | ORAL | 0 refills | Status: AC | PRN

## 2024-02-24 MED ORDER — HYDROCODONE-ACETAMINOPHEN 5-325 MG PO TABS
2.0000 | ORAL_TABLET | Freq: Once | ORAL | Status: AC
  Administered 2024-02-24: 2 via ORAL
  Filled 2024-02-24: qty 2

## 2024-02-24 NOTE — ED Triage Notes (Signed)
 Pt c/o foot injury around 0430 this morning. Does not recall what happened, denies any fall. Denies taking anything for the pain. Left foot pain.
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
